# Patient Record
Sex: Male | Born: 1935 | Race: Black or African American | Hispanic: No | Marital: Married | Smoking: Never smoker
Health system: Southern US, Community
[De-identification: ages and names within clinical notes are randomized; demographics above are authoritative.]

## PROBLEM LIST (undated history)

## (undated) DIAGNOSIS — I10 Essential (primary) hypertension: Secondary | ICD-10-CM

## (undated) DIAGNOSIS — N289 Disorder of kidney and ureter, unspecified: Secondary | ICD-10-CM

## (undated) DIAGNOSIS — K589 Irritable bowel syndrome without diarrhea: Secondary | ICD-10-CM

## (undated) DIAGNOSIS — S37009A Unspecified injury of unspecified kidney, initial encounter: Secondary | ICD-10-CM

## (undated) HISTORY — DX: Unspecified injury of unspecified kidney, initial encounter: S37.009A

## (undated) HISTORY — DX: Irritable bowel syndrome, unspecified: K58.9

## (undated) HISTORY — DX: Disorder of kidney and ureter, unspecified: N28.9

## (undated) HISTORY — PX: CHOLECYSTECTOMY: SHX55

## (undated) HISTORY — DX: Essential (primary) hypertension: I10

---

## 2009-03-07 ENCOUNTER — Ambulatory Visit (HOSPITAL_COMMUNITY): Admission: RE | Admit: 2009-03-07 | Discharge: 2009-03-07 | Payer: Self-pay | Admitting: Otolaryngology

## 2010-03-12 ENCOUNTER — Encounter: Payer: Self-pay | Admitting: Orthopedic Surgery

## 2010-03-19 ENCOUNTER — Ambulatory Visit: Payer: Self-pay | Admitting: Orthopedic Surgery

## 2010-03-19 DIAGNOSIS — IMO0002 Reserved for concepts with insufficient information to code with codable children: Secondary | ICD-10-CM | POA: Insufficient documentation

## 2010-03-19 DIAGNOSIS — M1710 Unilateral primary osteoarthritis, unspecified knee: Secondary | ICD-10-CM | POA: Insufficient documentation

## 2010-03-31 IMAGING — CT CT NECK W/ CM
3 series · 14 of 33 positions shown, 17 images · IV contrast (Omnipaque 300)
Comparison: None.

CLINICAL DATA: 77-year-old male with right parotid swelling and
sialoadenitis.

CT NECK WITH CONTRAST
TECHNIQUE: Multidetector CT imaging of the neck was performed with
intravenous contrast.
Contrast: 75 ml Xmnipaque-M99.

[Series 2: soft tissue neck 3.0 b31s · axial · 0.40mm/px · z∈[+48,+228]mm · 6 of 78 slices shown, 8 images]
[im 12/78  soft-tissue]
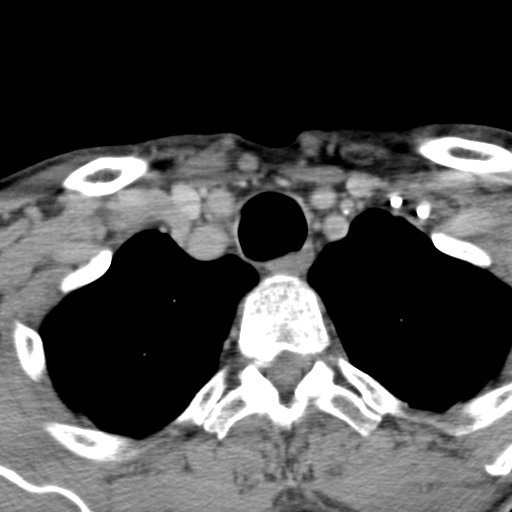
[im 12/78  bone]
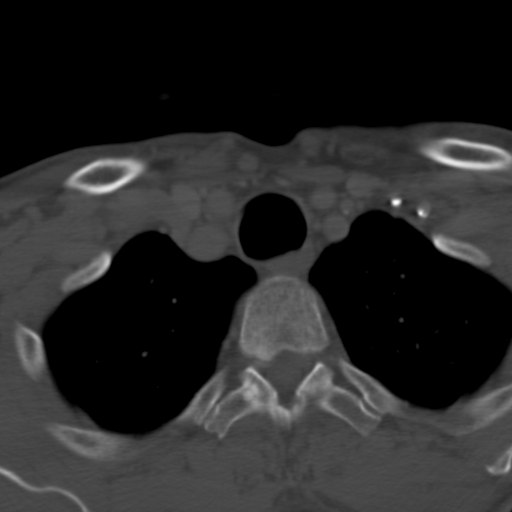
[im 24/78  bone]
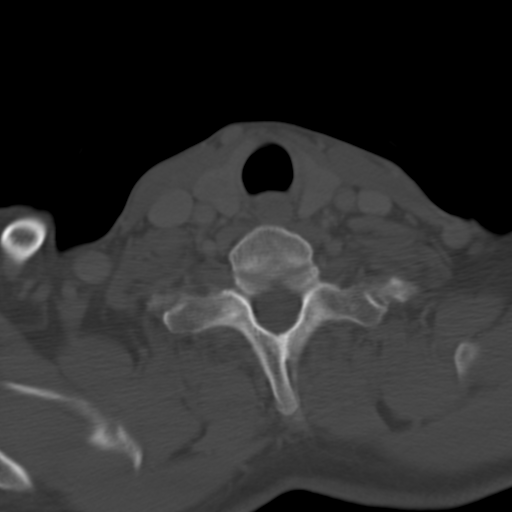
[im 36/78  bone]
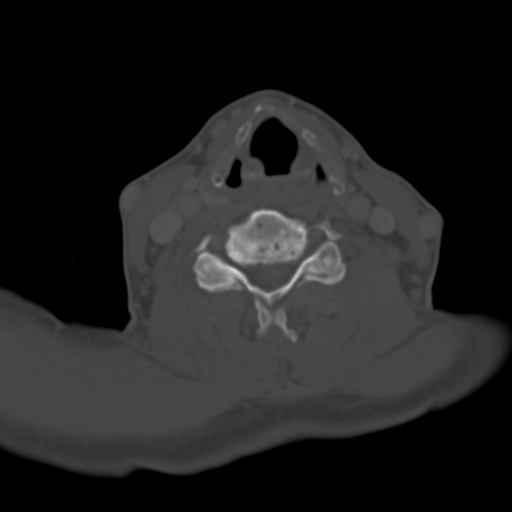
[im 48/78  bone]
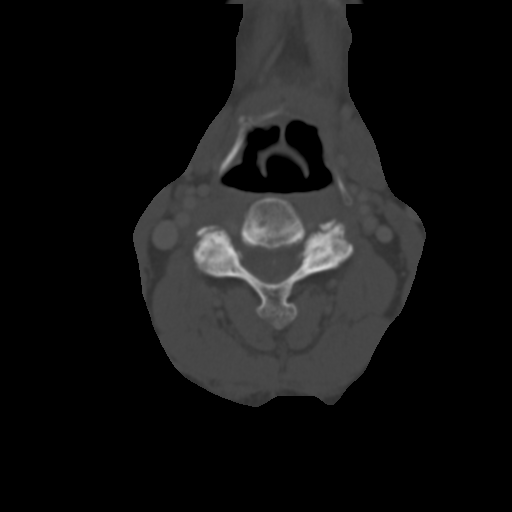
[im 60/78  soft-tissue]
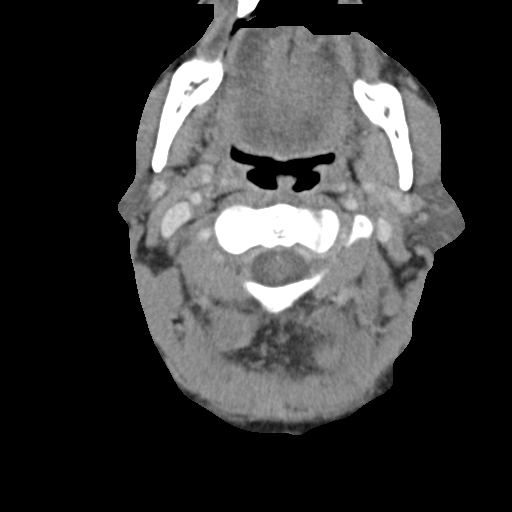
[im 60/78  bone]
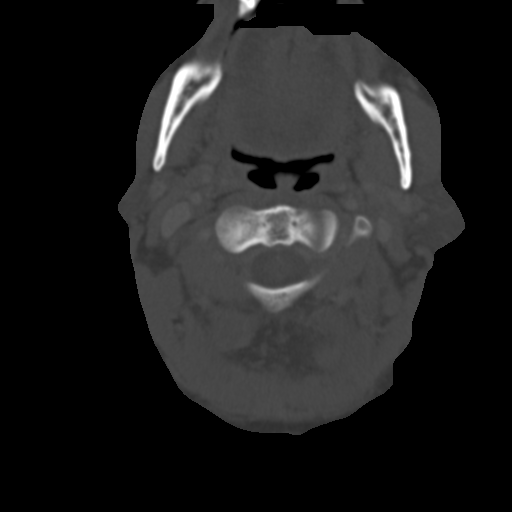
[im 72/78  bone]
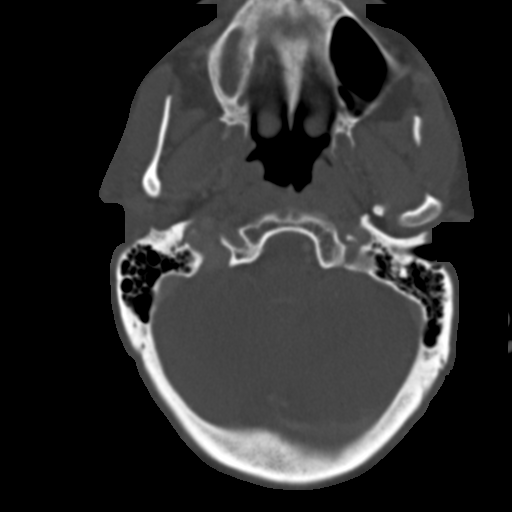

[Series 4: neck 3.0 soft tissue coronal · coronal · 0.35mm/px · 3 of 53 slices shown]
[im 11/53  bone]
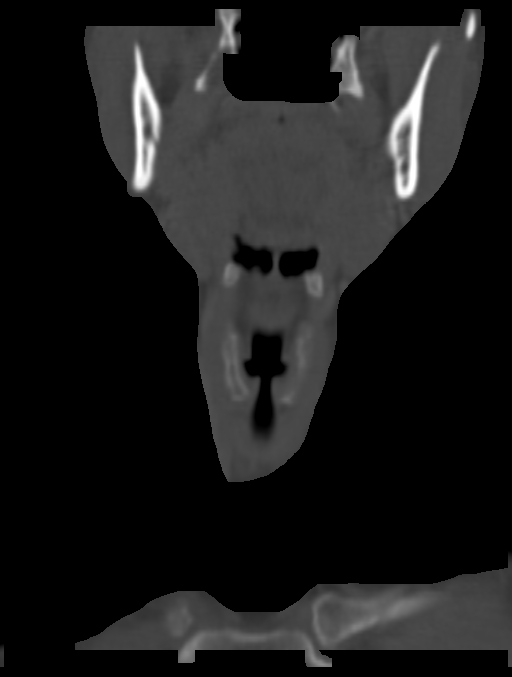
[im 21/53  bone]
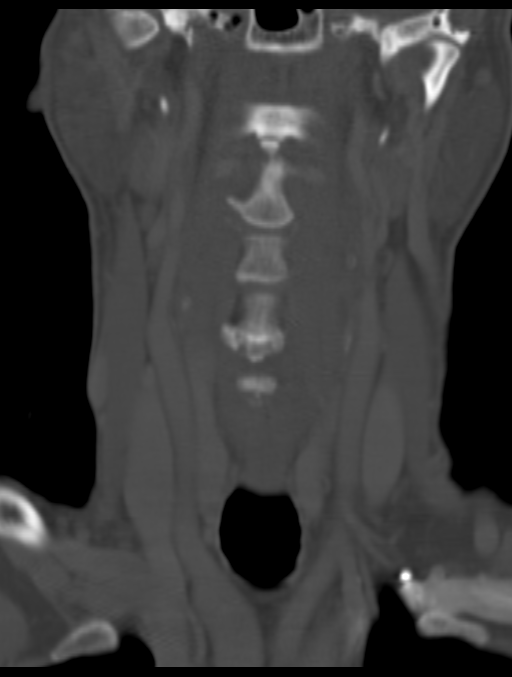
[im 32/53  bone]
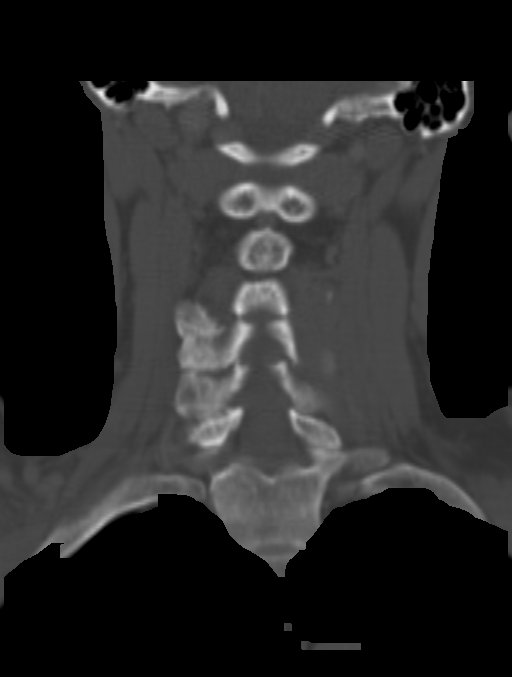

[Series 5: neck 3.0 soft tissue sag · sagittal · 0.38mm/px · 5 of 54 slices shown, 6 images]
[im 18/54  bone]
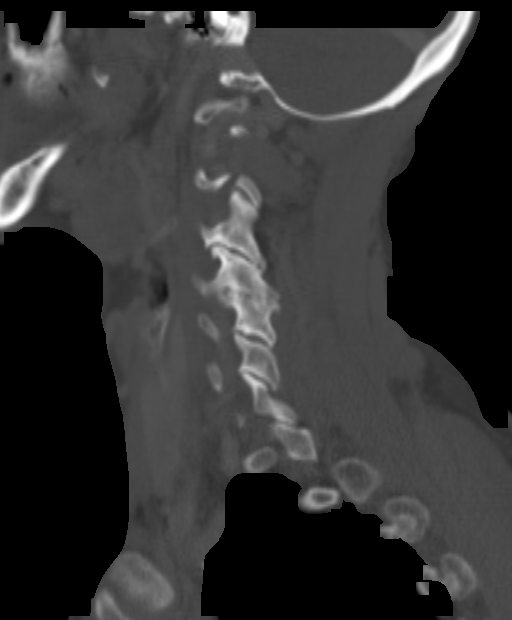
[im 23/54  bone]
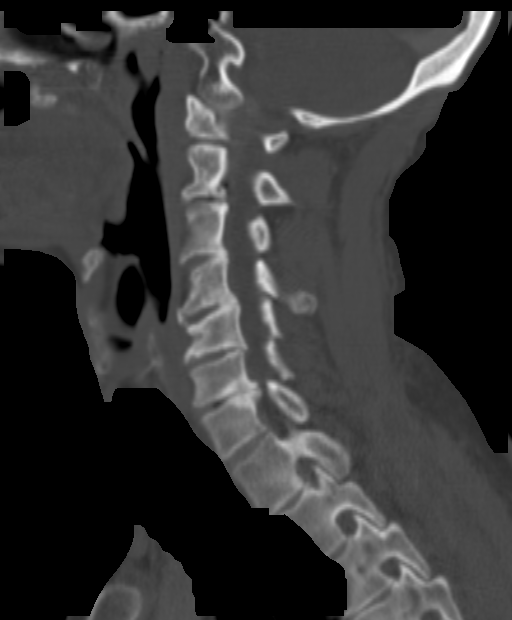
[im 27/54  soft-tissue]
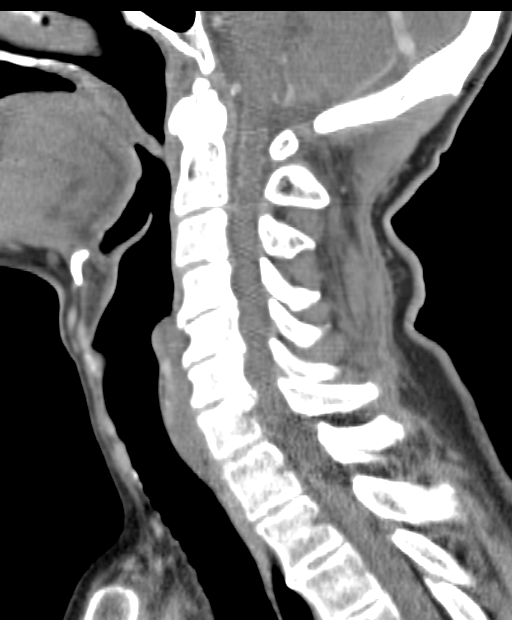
[im 27/54  bone]
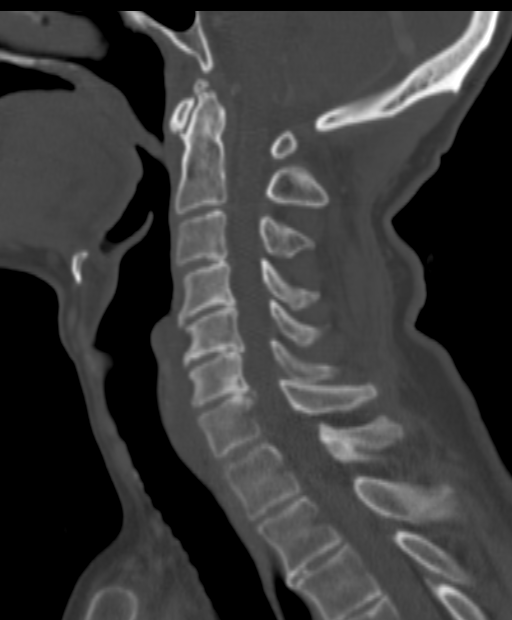
[im 31/54  bone]
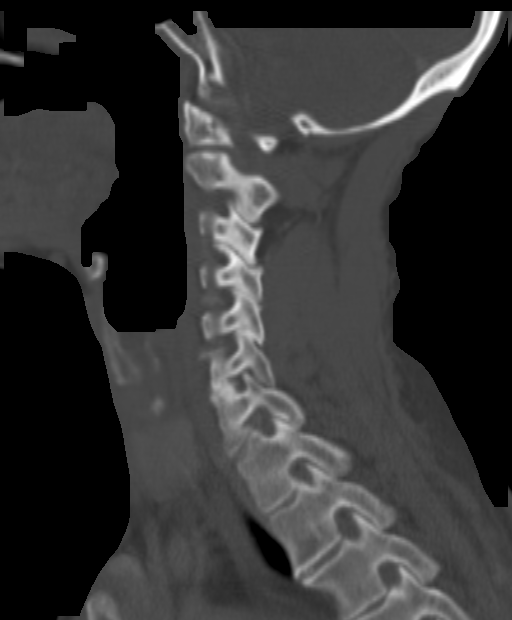
[im 36/54  bone]
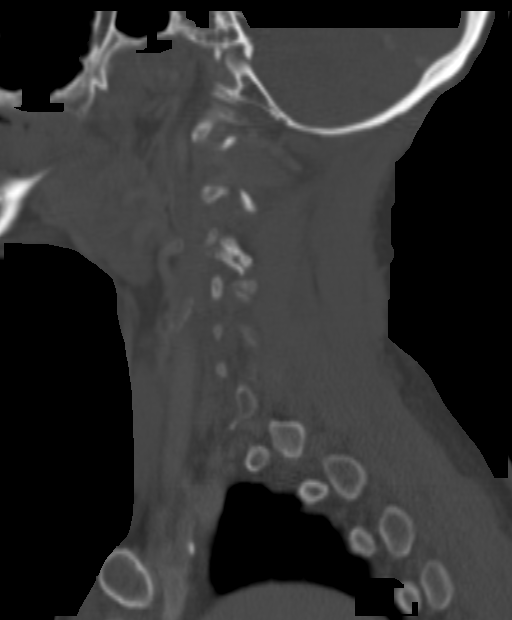

[14 of 33 positions shown; findings below may reference images not displayed]

FINDINGS: Partially calcified mucosal thickening in the right
maxillary sinus.  Otherwise, visualized paranasal sinuses and
mastoids are clear.  Multilevel disc and facet degeneration in the
cervical spine.  No acute osseous abnormality identified.
Visualized lung apices are clear.  Pharyngeal mucosal spaces are
within normal limits.  Parapharyngeal, retropharyngeal, and
sublingual spaces are within normal limits.  Submandibular glands
are within normal limits.  Thyroid is within normal limits.  Major
vascular structures in the neck are patent; the left vertebral
artery is nondominant.  There is a left greater than right carotid
bifurcation atherosclerosis.  No lymphadenopathy in the neck or
visualized superior mediastinum.

BB markers were placed on the area of clinical concern which does
correspond to the right parotid gland.  Parotid parenchyma
attenuation and enhancement appears symmetric, and no definite
inflammatory changes of the right parotid or surrounding
subcutaneous fat are noted.  Incidental accessory anterior parotid
tissue on the left is seen.  Both parotid gland ducts appear
nondilated and within normal limits.  No sialolithiasis is
identified.
IMPRESSION: 1.  CT appearance of the right parotid gland is within normal
limits.  No sialolithiasis identified.  Negative left parotid gland
also.
2.  No acute findings identified in the neck.
3.  Left greater than right carotid bifurcation atherosclerosis.
Degenerative changes in the cervical spine.  Right maxillary
chronic sinusitis.

## 2010-11-30 ENCOUNTER — Encounter: Payer: Self-pay | Admitting: Otolaryngology

## 2010-12-02 ENCOUNTER — Ambulatory Visit: Admit: 2010-12-02 | Payer: Self-pay | Admitting: Internal Medicine

## 2010-12-09 NOTE — Letter (Signed)
Summary: History form  History form   Imported By: Jacklynn Ganong 03/20/2010 08:08:38  _____________________________________________________________________  External Attachment:    Type:   Image     Comment:   External Document

## 2010-12-09 NOTE — Assessment & Plan Note (Signed)
Summary: bi knee pain needs xr/humana/bsf   Vital Signs:  Patient profile:   77 year old male Height:      73 inches Weight:      177 pounds Pulse rate:   66 / minute Resp:     16 per minute  Vitals Entered By: Fuller Canada MD (Mar 19, 2010 9:08 AM)  Visit Type:  new patient Referring Provider:  self Primary Provider:  Dr. Sherril Croon  CC:  right knee pain.  History of Present Illness: 77 year old male recently seen in the medical history of hypertension. 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 pedis presents for evaluation of bilateral knee pain, RIGHT greater than LEFT.  Dr. Addison Bailey records indicate multiple injections in the RIGHT knee, including Orthovisc.  Patient says his functional activities are fairly good. He has pain when he walks.  Quality throbbing. Scale 9/10. Constant. Came on gradually associated symptoms or swelling. Denies locking or catching.     Allergies (verified): No Known Drug Allergies  Past History:  Past Medical History: htn  Past Surgical History: gallbladder  Family History: FH of Cancer:  Family History Coronary Heart Disease male < 53 Family History of Arthritis  Social History: Patient is married.  assembler no smoking no alcohol coffee daily  Review of Systems Respiratory:  Complains of snoring; denies short of breath, wheezing, couch, tightness, pain on inspiration, and snoring . Gastrointestinal:  Complains of diarrhea; denies heartburn, nausea, vomiting, constipation, and blood in your stools. Musculoskeletal:  Complains of joint pain; denies swelling, instability, stiffness, redness, heat, and muscle pain.  The review of systems is negative for Constitutional, Cardiovascular, Genitourinary, Neurologic, Endocrine,  Psychiatric, Skin, HEENT, Immunology, and Hemoatologic.  Physical Exam  Additional Exam:  1. General appearance was normal  2. Oriented x 3  3. Mood was normal  4. Gait was abnormal, varus alignment in both knees  bilateral varus deformity with medial joint line tenderness bilaterally, RIGHT greater than LEFT.  Range of motion 105 with a flexion contracture 5 in each knee. Joint stability was normal. Motor, graded as 5.  Lower extremities. Skin was normal. Pulses normal;  normal sensation;  normal reflexes    Impression & Recommendations:  Problem # 1:  DEGENERATIVE JOINT DISEASE, KNEES, BILATERAL (ICD-715.96) Assessment New  bilateral knee films the patient has bone to bone changes medial compartment with moderate varus deformity, RIGHT greater than LEFT patellofemoral joint only mildly involved.  Impression bilateral osteoarthritis with good function at this time.  Recommend aspiration injection RIGHT knee, Tylenol arthritis 3 times a day for pain.  Recommend joint replacement, class.  A replacement pamphlet recommend reading that as well.  Verbal consent was obtained. The knee was prepped with alcohol and ethyl chloride. 1 cc of depomedrol 40mg /cc and 4 cc of lidocaine 1% was injected. there were no complications. Aspirated approximately 25 cc of clear, yellow fluid from the RIGHT knee prior to her injection  Orders: New Patient Level IV (21308) Knees  x-ray bilateral ([id number]) Depo- Medrol 40mg  (J1030) Joint Aspirate / Injection, Large (20610)  Patient Instructions: 1)  You have received an injection of cortisone today. You may experience increased pain at the injection site. Apply ice pack to the area for 20 minutes every 2 hours and take 2 xtra strength tylenol every 8  hours. This increased pain will usually resolve in 24 hours. The injection will take effect in 3-10 days.  2)  tylenol arthritis for pain  up to three times a day  3)  Please schedule a follow-up  appointment in 6 months.

## 2010-12-09 NOTE — Letter (Signed)
Summary: Previous notes brought by the patient  Previous notes brought by the patient   Imported By: Jacklynn Ganong 03/19/2010 16:19:44  _____________________________________________________________________  External Attachment:    Type:   Image     Comment:   External Document

## 2011-01-06 ENCOUNTER — Ambulatory Visit (INDEPENDENT_AMBULATORY_CARE_PROVIDER_SITE_OTHER): Payer: Medicare HMO | Admitting: Internal Medicine

## 2011-01-06 DIAGNOSIS — R1031 Right lower quadrant pain: Secondary | ICD-10-CM

## 2011-01-06 DIAGNOSIS — R1032 Left lower quadrant pain: Secondary | ICD-10-CM

## 2011-01-06 DIAGNOSIS — R197 Diarrhea, unspecified: Secondary | ICD-10-CM

## 2011-02-04 ENCOUNTER — Ambulatory Visit (HOSPITAL_COMMUNITY)
Admission: RE | Admit: 2011-02-04 | Discharge: 2011-02-04 | Disposition: A | Discharge status: Home / Self Care | Payer: Medicare HMO | Source: Ambulatory Visit | Attending: Internal Medicine | Admitting: Internal Medicine

## 2011-02-04 ENCOUNTER — Encounter (HOSPITAL_BASED_OUTPATIENT_CLINIC_OR_DEPARTMENT_OTHER): Payer: Medicare HMO | Admitting: Internal Medicine

## 2011-02-04 ENCOUNTER — Other Ambulatory Visit (INDEPENDENT_AMBULATORY_CARE_PROVIDER_SITE_OTHER): Payer: Self-pay | Admitting: Internal Medicine

## 2011-02-04 DIAGNOSIS — R197 Diarrhea, unspecified: Secondary | ICD-10-CM | POA: Insufficient documentation

## 2011-02-04 DIAGNOSIS — R109 Unspecified abdominal pain: Secondary | ICD-10-CM

## 2011-02-04 DIAGNOSIS — K5730 Diverticulosis of large intestine without perforation or abscess without bleeding: Secondary | ICD-10-CM | POA: Insufficient documentation

## 2011-02-04 DIAGNOSIS — I10 Essential (primary) hypertension: Secondary | ICD-10-CM | POA: Insufficient documentation

## 2011-02-04 DIAGNOSIS — Z79899 Other long term (current) drug therapy: Secondary | ICD-10-CM | POA: Insufficient documentation

## 2011-02-04 DIAGNOSIS — K648 Other hemorrhoids: Secondary | ICD-10-CM | POA: Insufficient documentation

## 2011-02-17 NOTE — Consult Note (Signed)
Kevin [name], [name]                 ACCOUNT NO.:  [id number]  MEDICAL RECORD NO.:  [id number]           PATIENT TYPE:  LOCATION:                                 FACILITY:  PHYSICIAN:  Lionel December, M.D.    DATE OF BIRTH:  11-Sep-1931  DATE OF CONSULTATION: DATE OF DISCHARGE:                                CONSULTATION   REASON FOR CONSULTATION:  Diarrhea and lower abdominal pain.  HISTORY OF PRESENT ILLNESS:  Kevin [name] is a 77 year old black male referred to our office by Dr. Sherril Croon for possible irritable bowel syndrome.  Kevin [name] states that when he eats he will immediately have to go and have a bowel movement.  He states that this occurs about every time he eats.  His symptoms have been worse in the last year.  He denies any type of constipation.  He states his stools are loose and sometimes they are watery.  With his bowel movements, he will have lower abdominal pain, which will resolve after having a bowel movement.  He usually have two or three stools a day.  His appetite has been okay.  He has had no recent weight loss.  No rectal bleeding or melena.  His stools are tan in color.  He did tell me that years ago, Imodium helped with his symptoms.  He does state that he went to [place], [place] recently and had a cheeseburger and before getting back home, he had three stools.  His last colonoscopy was in 2003 by Dr. Gabriel Cirri, which revealed a normal colonoscopy, this was for screening purposes.  There were no lesions.  REVIEW OF SYSTEMS:  He denies any weight loss, no appetite changes, no nausea or vomiting.  No constipation.  No melena or rectal bleeding.  There are no known allergies.  MEDICAL HISTORY:  He had a cholecystectomy over 20 years ago and history of hypertension.  FAMILY HISTORY:  His mother is deceased from natural causes at age 53. His father is deceased from black lung.  He has one brother alive with prostate cancer.  He is married.  He is retired from  [employer]. He does not smoke, drink or do drugs.  He has one child in good health.  HOME MEDICATIONS:  Amlodipine 10 mg daily and Metamucil as needed.  OBJECTIVE:  VITAL SIGNS:  His weight is 179.5, height 6 feet and 1 inch, temperature 97.1, blood pressure 152/50, pulse 56. HEENT:  He has upper dentures and natural lower teeth.  His oral mucosa is moist.  There are no lesions.  His conjunctiva is pink.  His sclerae are anicteric.  His thyroid is normal. NECK:  There is no cervical lymphadenopathy. LUNGS:  Clear. HEART:  Regular rate and rhythm. ABDOMEN:  Soft.  Bowel sounds are positive.  No masses and no tenderness.  His stool was brown, guaiac negative. EXTREMITIES:  There is no edema to his lower extremities.  LABORATORY DATA:  On December 19, 2010; glucose 115, BUN 20, creatinine 1.66, calcium 9.0, total protein 6.8, albumin 3.7, ALP 85, AST 16, total bilirubin 0.3, ALT 12.  Sodium 140,  potassium 3.5, chloride 108. Hemoglobin is 13.0, hematocrit is 38.2, MCV 88.4, platelets are 177. Hepatitis C antibody was negative.  ANA is negative.  ASSESSMENT:  Kevin [name] is a 77 year old male presenting today with complaints of frequent stools up to two to three a day and lower abdominal pain is also associated with his loose stools.  This could possibly a component of irritable bowel syndrome.  His loose stools are related to meals.  Colonic neoplasm needs to be ruled out, this is probably irritable bowel syndrome.  RECOMMENDATIONS:  His last colonoscopy was in 2003.  It is reasonable to proceed with a colonoscopy to be sure he does not have a colonic neoplasm.  He can take Imodium on schedule twice a day.  Thank you for allowing [place] to participate in his care.    ______________________________ Dorene Ar, NP   ______________________________ Lionel December, M.D.    TS/MEDQ  D:  01/06/2011  T:  01/07/2011  Job:  [id number]  cc:   Dr. Sherril Croon  Electronically Signed by Dorene Ar PA on 02/10/2011 08:39:46 AM Electronically Signed by Lionel December M.D. on 02/17/2011 02:44:56 PM

## 2011-02-17 NOTE — Op Note (Signed)
  NAMEJOUSHA, Kevin [name]                 ACCOUNT NO.:  [id number]  MEDICAL RECORD NO.:  [id number]           PATIENT TYPE:  O  LOCATION:  [facility]                          FACILITY:  [facility]  PHYSICIAN:  Lionel December, M.D.    DATE OF BIRTH:  October 09, 1931  DATE OF PROCEDURE: DATE OF DISCHARGE:                              OPERATIVE REPORT   PROCEDURE:  Colonoscopy.  INDICATIONS:  Kevin [name] a 77 year old African American male who has chronic diarrhea presumed to be due to irritable bowel syndrome.  He also has lower abdominal pain or cramps.  He is undergoing diagnostic colonoscopy.  Procedures were reviewed with the patient.  Informed consent was obtained.  MEDS FOR CONSCIOUS SEDATION: 1. Demerol 50 mg IV. 2. Versed 3 mg IV.  FINDINGS:  Procedure performed in endoscopy suite.  The patient's vital signs and O2 sat were monitored during the procedure and remained stable.  The patient was placed in left lateral recumbent position. Rectal examination performed.  No abnormality noted on external or digital exam.  Pentax videoscope was placed into the rectum and advanced under vision into sigmoid colon which was tortuous and somewhat noncompliant.  Multiple diverticula noted at sigmoid with few more at the descending colon.  Using abdominal pressure and by repeatedly withdrawing scope, I was able to advance it further and finally into the cecum which was identified by appendiceal orifice and ileocecal valve. Pictures were taken for the record.  Prep was excellent.  As the scope was withdrawn, colonic mucosa was carefully examined and was normal throughout.  Random biopsies were taken from mucosa at distal sigmoid colon looking for collagenous or microscopic colitis.  Rectal mucosa was normal.  Scope was retroflexed to examine anorectal junction.  Single small hemorrhoid noted above the dentate line.  Endoscope was then withdrawn.  Withdrawal time was 14 minutes.  The patient tolerated the procedure  well.  FINAL DIAGNOSES: 1. Left-sided diverticulosis.  He has multiple diverticula at sigmoid     and few at descending colon. 2. No evidence of colonic polyps or endoscopic colitis. 3. Random biopsies taken from mucosa of sigmoid colon. 4. Small internal hemorrhoids.  RECOMMENDATIONS: 1. He will resume his usual meds. 2. High-fiber diet plus fiber supplement 3-4 g daily. 3. Imodium OTC 2 mg daily before breakfast.  I will be contacting the     patient with results of biopsy and further recommendations.     Lionel December, M.D.     NR/MEDQ  D:  02/04/2011  T:  02/05/2011  Job:  [id number]  cc:   Dr. Sherril Croon  Electronically Signed by Lionel December M.D. on 02/17/2011 02:45:35 PM

## 2011-07-01 ENCOUNTER — Encounter (INDEPENDENT_AMBULATORY_CARE_PROVIDER_SITE_OTHER): Payer: Self-pay | Admitting: Internal Medicine

## 2011-07-01 ENCOUNTER — Ambulatory Visit (INDEPENDENT_AMBULATORY_CARE_PROVIDER_SITE_OTHER): Payer: Medicare HMO | Admitting: Internal Medicine

## 2011-07-01 VITALS — BP 122/50 | Temp 97.9°F | Ht 73.0 in | Wt 169.8 lb

## 2011-07-01 DIAGNOSIS — K589 Irritable bowel syndrome without diarrhea: Secondary | ICD-10-CM

## 2011-07-01 DIAGNOSIS — K580 Irritable bowel syndrome with diarrhea: Secondary | ICD-10-CM

## 2011-07-01 MED ORDER — HYOSCYAMINE SULFATE 0.125 MG SL SUBL: 0.1250 mg | SUBLINGUAL_TABLET | Freq: Two times a day (BID) | SUBLINGUAL | Status: DC

## 2011-07-01 NOTE — Progress Notes (Signed)
Subjective:     Patient ID: Kevin [name], male   DOB: 09-11-1931, 77 y.o.   MRN: [id number]  HPI Kevin [name] is a 77 yr old black male presents today with c/o of IBS.  He says as soon as he eats he has to go to the bathroom about 2-3 times to have a BM.  He is having 4-5 stools a day.  The first stool is formed and then the rest will be loose.  When he has to have a BM he will have lower abdominal cramps.  Appetite is good. He has lost about 10 pounds since his last visit. No acid reflux.  No epigastric pain.  No rectal bleeding or melena. He underwent a cholecystectomy over 25 yrs ago.  He says for the most part he feels good.  He underwent a colonoscopy in March of this year for chronic diarrhea. Findings: Left sided diverticulosis.  Multiple diverticula at sigmoid colon and few at descending colon.  No evidence of colonic polyps or endoscopic colitis. Small internal hemorrhoids. Biopsy: No evidence of microscopic colitis.    Current Outpatient Prescriptions  Medication Sig Dispense Refill  . amLODipine (NORVASC) 10 MG tablet Take 10 mg by mouth daily.        Marland Kitchen loperamide (IMODIUM A-D) 2 MG tablet Take 2 mg by mouth 4 (four) times daily as needed.        . hyoscyamine (LEVSIN/SL) 0.125 MG SL tablet Place 1 tablet (0.125 mg total) under the tongue 2 (two) times daily.  60 tablet  3   Allergies: NKA  Review of Systems see hpi     Objective:   Physical Exam Blood pressure 122/50, temperature 97.9 F (36.6 C), height 6\' 1"  (1.854 m), weight 169 lb 12.8 oz (77.021 kg). Alert and oriented. Skin warm and dry. Oral mucosa is moist.  Dentures upper and lower.Sclera anicteric, conjunctivae is pink. Thyroid not enlarged. No cervical lymphadenopathy. Lungs clear. Heart regular rate and rhythm.  Abdomen is soft. Bowel sounds are positive. No hepatomegaly. No abdominal masses felt. No tenderness.  No edema to lower extremities. Patient is alert and oriented.      Assessment:     This is probably Irritable  syndrome given his history of frequent stools.    Plan:     Continue to Imodium once a day.  He will start fiber pills. I will e-prescribe Levsin 0.125mg  sl q 12hrs  # 60 with 3 refllls .  He will follow up in 3 months.

## 2011-09-10 ENCOUNTER — Encounter (INDEPENDENT_AMBULATORY_CARE_PROVIDER_SITE_OTHER): Payer: Self-pay | Admitting: Internal Medicine

## 2011-09-21 ENCOUNTER — Ambulatory Visit (INDEPENDENT_AMBULATORY_CARE_PROVIDER_SITE_OTHER): Payer: Medicare HMO | Admitting: Internal Medicine

## 2011-09-23 ENCOUNTER — Encounter (INDEPENDENT_AMBULATORY_CARE_PROVIDER_SITE_OTHER): Payer: Self-pay | Admitting: Internal Medicine

## 2011-09-23 ENCOUNTER — Ambulatory Visit (INDEPENDENT_AMBULATORY_CARE_PROVIDER_SITE_OTHER): Payer: Medicare HMO | Admitting: Internal Medicine

## 2011-09-23 DIAGNOSIS — K589 Irritable bowel syndrome without diarrhea: Secondary | ICD-10-CM

## 2011-09-23 DIAGNOSIS — I10 Essential (primary) hypertension: Secondary | ICD-10-CM

## 2011-09-23 MED ORDER — DICYCLOMINE HCL 20 MG PO TABS: 20.0000 mg | ORAL_TABLET | Freq: Two times a day (BID) | ORAL | Status: DC

## 2011-09-23 NOTE — Progress Notes (Signed)
Subjective:     Patient ID: Kevin [name], male   DOB: 1931/10/23, 77 y.o.   MRN: [id number]  HPI Kevin [name] is a 77 yr old male here today for a scheduled visit.  He is presently taking Imodium prn. He is having 2-3 times a day.  Some are formed and some are not. Some are loose. He had tried Levsin and it did not help. Appetite is okay . He has lost about 3 pounds. No melena or bright red rectal bleeding. He has had IBS for years.  No bloating.  His last colonoscopy was in March of this year which revealed left sided diverticulosis.. Multiple diverticula at the sigmoid and a few at descending colon.   Review of Systems see hpi     Current Outpatient Prescriptions  Medication Sig Dispense Refill  . amLODipine (NORVASC) 10 MG tablet Take 10 mg by mouth daily.        Marland Kitchen loperamide (IMODIUM A-D) 2 MG tablet Take 2 mg by mouth 4 (four) times daily as needed.        . dicyclomine (BENTYL) 20 MG tablet Take 1 tablet (20 mg total) by mouth 2 (two) times daily before a meal.  60 tablet  1   Current Outpatient Prescriptions on File Prior to Visit  Medication Sig Dispense Refill  . amLODipine (NORVASC) 10 MG tablet Take 10 mg by mouth daily.        Marland Kitchen loperamide (IMODIUM A-D) 2 MG tablet Take 2 mg by mouth 4 (four) times daily as needed.         Past Medical History  Diagnosis Date  . Hypertension    History   Social History Narrative  . No narrative on file   History   Social History  . Marital Status: Married    Spouse Name: N/A    Number of Children: N/A  . Years of Education: N/A   Occupational History  . Not on file.   Social History Main Topics  . Smoking status: Never Smoker   . Smokeless tobacco: Not on file  . Alcohol Use: No  . Drug Use: No  . Sexually Active: Not on file   Other Topics Concern  . Not on file   Social History Narrative  . No narrative on file   Family Status  Relation Status Death Age  . Mother Deceased     age 97  . Father Deceased     Cold miners  disease  . Brother Alive     Hx prostate cancer and doing well   No Known Allergies  Objective:   Physical Exam  Filed Vitals:   09/23/11 1433  BP: 120/50  Pulse: 60  Temp: 97.7 F (36.5 C)  Height: 6\' 1"  (1.854 m)  Weight: 170 lb 14.4 oz (77.52 kg)   Alert and oriented. Skin warm and dry. Oral mucosa is moist.dentures. Sclera anicteric, conjunctivae is pink. Thyroid not enlarged. No cervical lymphadenopathy. Lungs clear. Heart regular rate and rhythm.  Abdomen is soft. Bowel sounds are positive. No hepatomegaly. No abdominal masses felt. No tenderness.  No edema to lower extremities. Patient is alert and oriented.      Assessment:    Probably IBS.  He recently underwent a normal colonoscopy.     Plan:     *F/u 1 year. Will call in Dicyclomine in for him to try. He will call with a PR in 2 weeks. High fiber diet.

## 2011-09-23 NOTE — Patient Instructions (Signed)
High fiber diet. Dicyclomine 10mg  BID. F/u in one year. PR in 2 weeks.

## 2012-03-15 ENCOUNTER — Encounter (INDEPENDENT_AMBULATORY_CARE_PROVIDER_SITE_OTHER): Payer: Self-pay | Admitting: *Deleted

## 2012-04-06 ENCOUNTER — Encounter (INDEPENDENT_AMBULATORY_CARE_PROVIDER_SITE_OTHER): Payer: Self-pay | Admitting: Internal Medicine

## 2012-04-06 ENCOUNTER — Ambulatory Visit (INDEPENDENT_AMBULATORY_CARE_PROVIDER_SITE_OTHER): Payer: Medicare HMO | Admitting: Internal Medicine

## 2012-04-06 VITALS — BP 148/52 | HR 69 | Temp 98.1°F | Ht 72.0 in | Wt 171.9 lb

## 2012-04-06 DIAGNOSIS — K589 Irritable bowel syndrome without diarrhea: Secondary | ICD-10-CM

## 2012-04-06 NOTE — Patient Instructions (Addendum)
Dicyclomine 20mg  TID. PR in 2 weeks.  OV in 6 months

## 2012-04-06 NOTE — Progress Notes (Signed)
Subjective:     Patient ID: Kevin [name], male   DOB: 10-23-1931, 78 y.o.   MRN: [id number]  HPI Kevin [name] is a 78 yr old male here today for f/u.Marland Kitchen  He says he is not any better. He is having 2-3 stools a day. Yesterday he had 7 stools. Stool were very loose. Appetite is good. No weight loss.  No abdominal pain except when he has a BM.   He tells me he has not been taking the Dicyclomine. He walks every day.  He is presently working a Statistician.  He takes Imodium on a prn basis.  His last colonoscopy was last year in March. Revealed left sided diverticulosis. Multiple diverticula at the sigmoid and a few at descending colon.    Review of Systems see hpi Current Outpatient Prescriptions  Medication Sig Dispense Refill  . amLODipine (NORVASC) 10 MG tablet Take 10 mg by mouth daily.        Marland Kitchen dicyclomine (BENTYL) 20 MG tablet Take 1 tablet (20 mg total) by mouth 2 (two) times daily before a meal.  60 tablet  1  . metoprolol tartrate (LOPRESSOR) 25 MG tablet Take 25 mg by mouth daily.      . hyoscyamine (LEVBID) 0.375 MG 12 hr tablet Take 0.375 mg by mouth every 12 (twelve) hours as needed.      . hyoscyamine (LEVSIN/SL) 0.125 MG SL tablet Place 1 tablet (0.125 mg total) under the tongue 2 (two) times daily.  60 tablet  3  . loperamide (IMODIUM A-D) 2 MG tablet Take 2 mg by mouth 4 (four) times daily as needed.         Past Medical History  Diagnosis Date  . Hypertension   . Irritable bowel    Past Surgical History  Procedure Date  . Cholecystectomy     over 20 yrs ago   Family Status  Relation Status Death Age  . Mother Deceased     age 31  . Father Deceased     Cold miners disease  . Brother Alive     Hx prostate cancer and doing well   History   Social History  . Marital Status: Married    Spouse Name: N/A    Number of Children: N/A  . Years of Education: N/A   Occupational History  . Not on file.   Social History Main Topics  . Smoking status: Never Smoker   . Smokeless  tobacco: Not on file  . Alcohol Use: No  . Drug Use: No  . Sexually Active: Not on file   Other Topics Concern  . Not on file   Social History Narrative  . No narrative on file   No Known Allergies      Objective:   Physical Exam Filed Vitals:   04/06/12 1109  Height: 6' (1.829 m)  Weight: 171 lb 14.4 oz (77.973 kg)  Alert and oriented. Skin warm and dry. Oral mucosa is moist.   . Sclera anicteric, conjunctivae is pink. Thyroid not enlarged. No cervical lymphadenopathy. Lungs clear. Heart regular rate and rhythm.  Abdomen is soft. Bowel sounds are positive. No hepatomegaly. No abdominal masses felt. No tenderness.  No edema to lower extremities.        Assessment:   Irritable bowel diarrhea predominant.     Plan:   Dicyclomine TID. PR in 2 weeks.  OV in 6 months.  Stop the Levsin

## 2012-04-11 ENCOUNTER — Other Ambulatory Visit (INDEPENDENT_AMBULATORY_CARE_PROVIDER_SITE_OTHER): Payer: Self-pay | Admitting: Internal Medicine

## 2012-04-11 NOTE — Telephone Encounter (Signed)
This has been addressed.

## 2012-04-12 NOTE — Telephone Encounter (Signed)
Talked with Kevin [name]; patient is at work. Advice that Kevin [name] stop dicyclomine and take Imodium OTC 2 mg once or twice daily for diarrhea. If Imodium does not work Kevin [name] will call us

## 2012-08-05 ENCOUNTER — Encounter: Payer: Medicare HMO | Admitting: Internal Medicine

## 2012-08-12 ENCOUNTER — Encounter (INDEPENDENT_AMBULATORY_CARE_PROVIDER_SITE_OTHER): Payer: Medicare HMO | Admitting: Internal Medicine

## 2012-08-12 DIAGNOSIS — D472 Monoclonal gammopathy: Secondary | ICD-10-CM

## 2012-08-12 DIAGNOSIS — N189 Chronic kidney disease, unspecified: Secondary | ICD-10-CM

## 2012-09-15 ENCOUNTER — Encounter: Payer: Self-pay | Admitting: Orthopedic Surgery

## 2012-09-15 ENCOUNTER — Ambulatory Visit (INDEPENDENT_AMBULATORY_CARE_PROVIDER_SITE_OTHER): Payer: Medicare HMO | Admitting: Orthopedic Surgery

## 2012-09-15 VITALS — Ht 73.0 in | Wt 170.0 lb

## 2012-09-15 DIAGNOSIS — I878 Other specified disorders of veins: Secondary | ICD-10-CM

## 2012-09-15 DIAGNOSIS — I872 Venous insufficiency (chronic) (peripheral): Secondary | ICD-10-CM

## 2012-09-15 NOTE — Patient Instructions (Addendum)
Consult with [facility]:  For varicose veins   Varicose Veins Varicose veins are veins that have become enlarged and twisted. CAUSES This condition is the result of valves in the veins not working properly. Valves in the veins help return blood from the leg to the heart. If these valves are damaged, blood flows backwards and backs up into the veins in the leg near the skin. This causes the veins to become larger. People who are on their feet a lot, who are pregnant, or who are overweight are more likely to develop varicose veins. SYMPTOMS    Bulging, twisted-appearing, bluish veins, most commonly found on the legs.   Leg pain or a feeling of heaviness. These symptoms may be worse at the end of the day.   Leg swelling.   Skin color changes.  DIAGNOSIS   Varicose veins can usually be diagnosed with an exam of your legs by your caregiver. He or she may recommend an ultrasound of your leg veins. TREATMENT   Most varicose veins can be treated at home. However, other treatments are available for people who have persistent symptoms or who want to treat the cosmetic appearance of the varicose veins. These include:  Laser treatment of very small varicose veins.   Medicine that is shot (injected) into the vein. This medicine hardens the walls of the vein and closes off the vein. This treatment is called sclerotherapy. Afterwards, you may need to wear clothing or bandages that apply pressure.   Surgery.  HOME CARE INSTRUCTIONS    Do not stand or sit in one position for long periods of time. Do not sit with your legs crossed. Rest with your legs raised during the day.   Wear elastic stockings or support hose. Do not wear other tight, encircling garments around the legs, pelvis, or waist.   Walk as much as possible to increase blood flow.   Raise the foot of your bed at night with 2-inch blocks.   If you get a cut in the skin over the vein and the vein bleeds, lie down with your  leg raised and press on it with a clean cloth until the bleeding stops. Then place a bandage (dressing) on the cut. See your caregiver if it continues to bleed or needs stitches.  SEEK MEDICAL CARE IF:    The skin around your ankle starts to break down.   You have pain, redness, tenderness, or hard swelling developing in your leg over a vein.   You are uncomfortable due to leg pain.  Document Released: 08/05/2005 Document Revised: 01/18/2012 Document Reviewed: 12/22/2010 [facility] Patient Information 2013 [place], [place].

## 2012-09-15 NOTE — Progress Notes (Signed)
Patient ID: Kevin [name], male   DOB: 1931-09-10, 78 y.o.   MRN: [id number] Chief Complaint  Patient presents with  . Leg Pain    Left leg pain, no injury.   78 year old male complains of pain and swelling of his left lower extremity. He saw his primary care doctor showed this to him he recommended a venous stasis stocking which the patient wore but did not get better. Symptoms started gradually associated with burning 6/10 constant pain associated with swelling and changes of the skin  Review of systems history diarrhea frequency urgency joint pain joint swelling  Past Medical History  Diagnosis Date  . Hypertension   . Irritable bowel     General appearance normal Orientation normal Mood and affect normal Gait and station normal Inspection of the left leg shows that there is skin discoloration also noted on the right leg. There is some tenderness some mild pitting edema. Ankle and knee range of motion are normal ankle and knee joints are stable. Muscle tone normal. Skin changes as noted. Pulse and temperature normal. Edema is noted. Multiple varicosities noted. Sensation normal. Balance normal.  Venous stasis disease  Recommend calf high prescription strength stockings and referral to [facility]

## 2012-09-20 ENCOUNTER — Telehealth: Payer: Self-pay | Admitting: Radiology

## 2012-09-20 NOTE — Telephone Encounter (Signed)
I faxed a referral for the patient to [facility].

## 2012-09-22 ENCOUNTER — Other Ambulatory Visit: Payer: Self-pay | Admitting: *Deleted

## 2012-09-22 DIAGNOSIS — I83893 Varicose veins of bilateral lower extremities with other complications: Secondary | ICD-10-CM

## 2012-09-23 ENCOUNTER — Telehealth: Payer: Self-pay | Admitting: Radiology

## 2012-09-23 NOTE — Telephone Encounter (Signed)
Patient has appointment with Dr. Hart Rochester on 10-10-12 at 11:00.

## 2012-10-05 ENCOUNTER — Encounter: Payer: Self-pay | Admitting: Vascular Surgery

## 2012-10-05 ENCOUNTER — Ambulatory Visit (INDEPENDENT_AMBULATORY_CARE_PROVIDER_SITE_OTHER): Payer: Medicare HMO | Admitting: Internal Medicine

## 2012-10-10 ENCOUNTER — Encounter: Payer: Self-pay | Admitting: Vascular Surgery

## 2012-10-10 ENCOUNTER — Ambulatory Visit (INDEPENDENT_AMBULATORY_CARE_PROVIDER_SITE_OTHER): Payer: Medicare HMO | Admitting: Vascular Surgery

## 2012-10-10 ENCOUNTER — Encounter (INDEPENDENT_AMBULATORY_CARE_PROVIDER_SITE_OTHER): Payer: Medicare HMO | Admitting: *Deleted

## 2012-10-10 VITALS — BP 165/66 | HR 52 | Resp 16 | Ht 73.0 in | Wt 171.0 lb

## 2012-10-10 DIAGNOSIS — I83893 Varicose veins of bilateral lower extremities with other complications: Secondary | ICD-10-CM

## 2012-10-10 DIAGNOSIS — I872 Venous insufficiency (chronic) (peripheral): Secondary | ICD-10-CM

## 2012-10-10 NOTE — Progress Notes (Signed)
Subjective:     Patient ID: Kevin [name], male   DOB: 03-Aug-1931, 78 y.o.   MRN: [id number]  HPI this 78 year old male was referred for evaluation of possible venous insufficiency left leg worse than right. He has noticed swelling in the lower aspect of both legs over the last several months with some aching throbbing discomfort in the lower third of the leg as the day progresses. He has no history of DVT, thrombophlebitis, bleeding, or severe distal edema. He has recently begun wearing elastic compression stockings in both legs up to the knee with some improvement. He does not elevate his legs on a regular basis and has no history of pulmonary embolism.  Past Medical History  Diagnosis Date  . Hypertension   . Irritable bowel     History  Substance Use Topics  . Smoking status: Never Smoker   . Smokeless tobacco: Not on file  . Alcohol Use: No    No family history on file.  No Known Allergies  Current outpatient prescriptions:amLODipine (NORVASC) 10 MG tablet, Take 10 mg by mouth daily.  , Disp: , Rfl: ;  dicyclomine (BENTYL) 20 MG tablet, Take 1 tablet (20 mg total) by mouth 2 (two) times daily before a meal., Disp: 60 tablet, Rfl: 1;  loperamide (IMODIUM A-D) 2 MG tablet, Take 2 mg by mouth 4 (four) times daily as needed.  , Disp: , Rfl: ;  metoprolol tartrate (LOPRESSOR) 25 MG tablet, Take 25 mg by mouth daily., Disp: , Rfl:  [DISCONTINUED] hyoscyamine (LEVBID) 0.375 MG 12 hr tablet, Take 0.375 mg by mouth every 12 (twelve) hours as needed., Disp: , Rfl:   BP 165/66  Pulse 52  Resp 16  Ht 6\' 1"  (1.854 m)  Wt 171 lb (77.565 kg)  BMI 22.56 kg/m2  Body mass index is 22.56 kg/(m^2).           Review of Systems denies chest pain, dyspnea on exertion, PND, orthopnea, hemoptysis, claudication. Other systems negative and complete review of systems     Objective:   Physical Exam blood pressure 165/66 heart rate 52 respirations 16 Gen.-alert and oriented x3 in no apparent  distress HEENT normal for age Lungs no rhonchi or wheezing Cardiovascular regular rhythm no murmurs carotid pulses 3+ palpable no bruits audible Abdomen soft nontender no palpable masses Musculoskeletal free of  major deformities Skin clear -no rashes Neurologic normal Lower extremities 3+ femoral and dorsalis pedis pulses palpable bilaterally with 1+ edema bilaterally. Left leg has some mildly bulging varicosities on the lateral aspect beginning at the knee and extending down into the pretibial area. There is no hyperpigmentation or ulceration in either lower extremity.  Today I ordered bilateral venous duplex exam which I reviewed and interpreted. There is no superficial reflux in the great or small saphenous systems bilaterally. There is deep venous reflux bilaterally. There is no DVT.       Assessment:     Bilateral venous insufficiency due to deep venous reflux-    Plan:     #1 elevate foot of bed 2 inches at night #2 wear short-leg elastic compression stockings on daily basis #3 no other specific treatment indicated do not believe varicose veins in left leg are symptomatic or need treatment

## 2013-03-15 ENCOUNTER — Encounter (INDEPENDENT_AMBULATORY_CARE_PROVIDER_SITE_OTHER): Payer: Medicare HMO | Admitting: Internal Medicine

## 2013-03-15 DIAGNOSIS — N184 Chronic kidney disease, stage 4 (severe): Secondary | ICD-10-CM

## 2013-03-15 DIAGNOSIS — D472 Monoclonal gammopathy: Secondary | ICD-10-CM

## 2013-07-19 ENCOUNTER — Encounter: Payer: Self-pay | Admitting: Orthopedic Surgery

## 2013-07-19 ENCOUNTER — Ambulatory Visit (INDEPENDENT_AMBULATORY_CARE_PROVIDER_SITE_OTHER): Payer: Medicare Other

## 2013-07-19 ENCOUNTER — Ambulatory Visit (INDEPENDENT_AMBULATORY_CARE_PROVIDER_SITE_OTHER): Payer: Medicare Other | Admitting: Orthopedic Surgery

## 2013-07-19 VITALS — BP 145/69 | Ht 73.0 in | Wt 174.0 lb

## 2013-07-19 DIAGNOSIS — M25562 Pain in left knee: Secondary | ICD-10-CM

## 2013-07-19 DIAGNOSIS — M25561 Pain in right knee: Secondary | ICD-10-CM

## 2013-07-19 DIAGNOSIS — M179 Osteoarthritis of knee, unspecified: Secondary | ICD-10-CM

## 2013-07-19 DIAGNOSIS — IMO0002 Reserved for concepts with insufficient information to code with codable children: Secondary | ICD-10-CM

## 2013-07-19 DIAGNOSIS — M25569 Pain in unspecified knee: Secondary | ICD-10-CM

## 2013-07-19 DIAGNOSIS — M1710 Unilateral primary osteoarthritis, unspecified knee: Secondary | ICD-10-CM

## 2013-07-19 NOTE — Patient Instructions (Signed)
Wear and Tear Disorders of the Knee (Arthritis, Osteoarthritis)  Everyone will experience wear and tear injuries (arthritis, osteoarthritis) of the knee. These are the changes we all get as we age. They come from the joint stress of daily living. The amount of cartilage damage in your knee and your symptoms determine if you need surgery. Mild problems require approximately two months recovery time. More severe problems take several months to recover. With mild problems, your surgeon may find worn and rough cartilage surfaces. With severe changes, your surgeon may find cartilage that has completely worn away and exposed the bone. Loose bodies of bone and cartilage, bone spurs (excess bone growth), and injuries to the menisci (cushions between the large bones of your leg) are also common. All of these problems can cause pain.  For a mild wear and tear problem, rough cartilage may simply need to be shaved and smoothed. For more severe problems with areas of exposed bone, your surgeon may use an instrument for roughing up the bone surfaces to stimulate new cartilage growth. Loose bodies are usually removed. Torn menisci may be trimmed or repaired.  ABOUT THE ARTHROSCOPIC PROCEDURE  Arthroscopy is a surgical technique. It allows your orthopedic surgeon to diagnose and treat your knee injury with accuracy. The surgeon looks into your knee through a small scope. The scope is like a small (pencil-sized) telescope. Arthroscopy is less invasive than open knee surgery. You can expect a more rapid recovery. After the procedure, you will be moved to a recovery area until most of the effects of the medication have worn off. Your caregiver will discuss the test results with you.  RECOVERY  The severity of the arthritis and the type of procedure performed will determine recovery time. Other important factors include age, physical condition, medical conditions, and the type of rehabilitation program. Strengthening your muscles after  arthroscopy helps guarantee a better recovery. Follow your caregiver's instructions. Use crutches, rest, elevate, ice, and do knee exercises as instructed. Your caregivers will help you and instruct you with exercises and other physical therapy required to regain your mobility, muscle strength, and functioning following surgery. Only take over-the-counter or prescription medicines for pain, discomfort, or fever as directed by your caregiver.   SEEK MEDICAL CARE IF:   · There is increased bleeding (more than a small spot) from the wound.  · You notice redness, swelling, or increasing pain in the wound.  · Pus is coming from wound.  · You develop an unexplained oral temperature above 102° F (38.9° C) , or as your caregiver suggests.  · You notice a foul smell coming from the wound or dressing.  · You have severe pain with motion of the knee.  SEEK IMMEDIATE MEDICAL CARE IF:   · You develop a rash.  · You have difficulty breathing.  · You have any allergic problems.  MAKE SURE YOU:   · Understand these instructions.  · Will watch your condition.  · Will get help right away if you are not doing well or get worse.  Document Released: 10/23/2000 Document Revised: 01/18/2012 Document Reviewed: 03/21/2008  [employer]® Patient Information ©2014 [employer].

## 2013-07-19 NOTE — Progress Notes (Signed)
Patient ID: Kevin [name], male   DOB: [birth year], 79 y.o.   MRN: [id number]  Chief Complaint  Patient presents with  . Knee Pain    Bilateral knee pain   HISTORY: 79 year old male previously seen by our practice given knee injections for arthritis presents with bilateral knee pain gradual onset present for over 2 years. Pain seems to come at the end of the day after activity rates his pain 8/10. Pain comes and goes better with rest associated with some swelling. Been functionally doing great well he has some kidney disease which prevents I believe anti-inflammatory medication  History of diarrhea urgency joint pain and swelling denies chest pain shortness of breath heartburn skin changes numbness or tingling. His current medications include 10 mg of amlodipine and a multivitamin he said a cholecystectomy he has hypertension and kidney disease family history of cancer he is married retired does not smoke  This is a well-developed well-nourished male grooming and hygiene are excellent he is oriented x3 his mood and affect are normal in place with no assistive devices. BP 145/69  Ht 6\' 1"  (1.854 m)  Wt 174 lb (78.926 kg)  BMI 22.96 kg/m2 Bilateral knee exam shows bilateral varus knees with medial joint line tenderness his range of motion is pretty much stuck at 115 both knees are stable he doesn't have a major joint effusion if any motor exam is normal skin is intact pulses are good sensation is normal balance is normal  Bilateral x-rays show severe arthritis bone to bone changes characteristic findings, see the report  Followup in 6 months reassess after bilateral knee injections today  Knee  Injection Procedure Note  Pre-operative Diagnosis: left knee oa  Post-operative Diagnosis: same  Indications: pain  Anesthesia: ethyl chloride   Procedure Details   Verbal consent was obtained for the procedure. Time out was completed.The joint was prepped with alcohol, followed by  Ethyl chloride  spray and A 20 gauge needle was inserted into the knee via lateral approach; 4ml 1% lidocaine and 1 ml of depomedrol  was then injected into the joint . The needle was removed and the area cleansed and dressed.  Complications:  None; patient tolerated the procedure well. Knee  Injection Procedure Note  Pre-operative Diagnosis: right knee oa  Post-operative Diagnosis: same  Indications: pain  Anesthesia: ethyl chloride   Procedure Details   Verbal consent was obtained for the procedure. Time out was completed.The joint was prepped with alcohol, followed by  Ethyl chloride spray and A 20 gauge needle was inserted into the knee via lateral approach; 4ml 1% lidocaine and 1 ml of depomedrol  was then injected into the joint . The needle was removed and the area cleansed and dressed.  Complications:  None; patient tolerated the procedure well.

## 2013-10-25 ENCOUNTER — Encounter (INDEPENDENT_AMBULATORY_CARE_PROVIDER_SITE_OTHER): Payer: Self-pay | Admitting: Internal Medicine

## 2013-10-25 ENCOUNTER — Ambulatory Visit (INDEPENDENT_AMBULATORY_CARE_PROVIDER_SITE_OTHER): Payer: Medicare Other | Admitting: Internal Medicine

## 2013-10-25 VITALS — BP 132/60 | HR 64 | Temp 98.1°F | Ht 73.0 in | Wt 178.2 lb

## 2013-10-25 DIAGNOSIS — K589 Irritable bowel syndrome without diarrhea: Secondary | ICD-10-CM

## 2013-10-25 MED ORDER — HYOSCYAMINE SULFATE 0.125 MG SL SUBL: 0.1250 mg | SUBLINGUAL_TABLET | SUBLINGUAL | Status: DC | PRN

## 2013-10-25 NOTE — Progress Notes (Signed)
Subjective:     Patient ID: Kevin [name], male   DOB: [birth year], 79 y.o.   MRN: [id number]  HPI Here today for f/u. He says when he eats, he will have lower abdominal pain. After he eats he has to go to the [facility] for a BM. Hx of IBS. When he eats cereal he will take Lactaid tab. Appetite is good. No weight loss. He has actually gained about 8 pounds since his last visit.   No abdominal cramps except after he eats. He usually has 0-to 5-6 times a day.  No melena or bright red rectal bleeding.   02/04/2011 Colonoscopy:FINAL DIAGNOSES: Diarrhea, lower abdominal pain 1. Left-sided diverticulosis. He has multiple diverticula at sigmoid  and few at descending colon.  2. No evidence of colonic polyps or endoscopic colitis.  3. Random biopsies taken from mucosa of sigmoid colon.  4. Small internal hemorrhoids. Biopsy: Negative  Review of Systems     Objective:   Physical Exam  Filed Vitals:   10/25/13 1549  BP: 132/60  Pulse: 64  Temp: 98.1 F (36.7 C)  Height: 6\' 1"  (1.854 m)  Weight: 178 lb 3.2 oz (80.831 kg)  Alert and oriented. Skin warm and dry. Oral mucosa is moist.   . Sclera anicteric, conjunctivae is pink. Thyroid not enlarged. No cervical lymphadenopathy. Lungs clear. Heart regular rate and rhythm.  Abdomen is soft. Bowel sounds are positive. No hepatomegaly. No abdominal masses felt. No tenderness.  No edema to lower extremities.        Assessment:    Diarrhea and lower abdominal cramps, presumed to be IBS. Normal exam today. Recent colonoscopy in 2012 did not show endoscopic colitis.     Plan:    Rx for Levsin 0.125mg  eprescribed to his pharmacy. Fiber 4 gms daily. OV in 1 month. I will talk with Dr. Karilyn Cota about a possible colonoscopy at patient and wife's request.

## 2013-10-25 NOTE — Patient Instructions (Addendum)
Rx for Levsin eprescribed to his pharmacy. Fiber 4 gms daily. OV in 1 month. I will talk with Dr. Karilyn Cota concerning repeat colonoscopy at patient's request.

## 2013-11-27 ENCOUNTER — Ambulatory Visit (INDEPENDENT_AMBULATORY_CARE_PROVIDER_SITE_OTHER): Payer: Medicare Other | Admitting: Internal Medicine

## 2013-12-20 ENCOUNTER — Ambulatory Visit (INDEPENDENT_AMBULATORY_CARE_PROVIDER_SITE_OTHER): Payer: Medicare Other | Admitting: Internal Medicine

## 2013-12-20 ENCOUNTER — Encounter (INDEPENDENT_AMBULATORY_CARE_PROVIDER_SITE_OTHER): Payer: Self-pay | Admitting: Internal Medicine

## 2013-12-20 VITALS — BP 142/50 | HR 60 | Temp 97.1°F | Ht 73.0 in | Wt 178.4 lb

## 2013-12-20 DIAGNOSIS — K589 Irritable bowel syndrome without diarrhea: Secondary | ICD-10-CM

## 2013-12-20 DIAGNOSIS — R197 Diarrhea, unspecified: Secondary | ICD-10-CM

## 2013-12-20 DIAGNOSIS — K529 Noninfective gastroenteritis and colitis, unspecified: Secondary | ICD-10-CM

## 2013-12-20 MED ORDER — HYOSCYAMINE SULFATE 0.125 MG SL SUBL: 0.1250 mg | SUBLINGUAL_TABLET | SUBLINGUAL | Status: AC | PRN

## 2013-12-20 MED ORDER — HYOSCYAMINE SULFATE 0.125 MG SL SUBL: 0.1250 mg | SUBLINGUAL_TABLET | SUBLINGUAL | Status: DC | PRN

## 2013-12-20 NOTE — Patient Instructions (Signed)
Celiac panel. OV in 3 months. Continue Levsin.

## 2013-12-20 NOTE — Progress Notes (Signed)
Subjective:     Patient ID: Kevin [name], male   DOB: 27-Dec-1930, 80 y.o.   MRN: [id number]  HPI  Here today for f/u of his IBS. He says someday are better than others.  Hx of chronic diarrhea. Symptoms for years. Given an Rx for Levsin which did not really help. He takes Metamucil daily. Appetite is good. No weight loss. No acid reflux.  Occasionally has lower abdominal pain with BMs. He usually has a BM   every meal. No melena or bright red rectal bleeding.     02/04/2011 Colonoscopy:INDICATIONS: [place] a 80 year old African American male who has chronic  diarrhea presumed to be due to irritable bowel syndrome. He also has  lower abdominal pain or cramps. H   FINAL DIAGNOSES: Diarrhea, lower abdominal pain  1. Left-sided diverticulosis. He has multiple diverticula at sigmoid  and few at descending colon.  2. No evidence of colonic polyps or endoscopic colitis.  3. Random biopsies taken from mucosa of sigmoid colon.  4. Small internal hemorrhoids.  Biopsy: Negative   Review of Systems Current Outpatient Prescriptions  Medication Sig Dispense Refill  . amLODipine (NORVASC) 10 MG tablet Take 10 mg by mouth daily.        . cholecalciferol (VITAMIN D) 400 UNITS TABS tablet Take 1,000 Units by mouth.      . loperamide (IMODIUM A-D) 2 MG tablet Take 2 mg by mouth 4 (four) times daily as needed.        . pantoprazole (PROTONIX) 40 MG tablet Take 40 mg by mouth daily.      . potassium chloride SA (K-DUR,KLOR-CON) 20 MEQ tablet Take 20 mEq by mouth 2 (two) times daily.      . hyoscyamine (LEVSIN SL) 0.125 MG SL tablet Place 1 tablet (0.125 mg total) under the tongue every 4 (four) hours as needed.  60 tablet  3   No current facility-administered medications for this visit.   Past Medical History  Diagnosis Date  . Hypertension   . Irritable bowel   . Kidney damage    Past Surgical History  Procedure Laterality Date  . Cholecystectomy      over 20 yrs ago   No Known Allergies    Objective:   Physical Exam  Filed Vitals:   12/20/13 1448  BP: 142/50  Pulse: 60  Temp: 97.1 F (36.2 C)  Height: 6\' 1"  (1.854 m)  Weight: 178 lb 6.4 oz (80.922 kg)   Alert and oriented. Skin warm and dry. Oral mucosa is moist.   . Sclera anicteric, conjunctivae is pink. Thyroid not enlarged. No cervical lymphadenopathy. Lungs clear. Heart regular rate and rhythm.  Abdomen is soft. Bowel sounds are positive. No hepatomegaly. No abdominal masses felt. No tenderness.  No edema to lower extremities. Patient is alert and oriented.     Assessment:    Chronic diarrhea. probably IBS. Hx of same for yrs. Recent colonoscopy was normal.  No microscopic colitis.   Plan:    Levsin .125mg  sl as needed. Continue the Metamucil. Celiac panel.  OV in 3 months.

## 2013-12-21 LAB — TISSUE TRANSGLUTAMINASE, IGA: TISSUE TRANSGLUTAMINASE AB, IGA: 11.8 U/mL (ref <20)

## 2013-12-21 LAB — GLIADIN ANTIBODIES, SERUM
Gliadin IgA: 13.6 U/mL (ref <20)
Gliadin IgG: 14.2 U/mL (ref <20)

## 2013-12-22 LAB — RETICULIN ANTIBODIES, IGA W TITER: Reticulin Ab, IgA: NEGATIVE

## 2014-01-15 ENCOUNTER — Telehealth: Payer: Self-pay | Admitting: Orthopedic Surgery

## 2014-01-15 NOTE — Telephone Encounter (Signed)
Patient called to cancel his 20-month appointment, due to tending to wife, in hospital.  Offered re-schedule; states he prefers to call back at a later time. when he can re-schedule.

## 2014-01-16 ENCOUNTER — Ambulatory Visit: Payer: Medicare Other | Admitting: Orthopedic Surgery

## 2014-04-09 ENCOUNTER — Ambulatory Visit (INDEPENDENT_AMBULATORY_CARE_PROVIDER_SITE_OTHER): Payer: Medicare Other | Admitting: Internal Medicine

## 2014-04-09 ENCOUNTER — Encounter (INDEPENDENT_AMBULATORY_CARE_PROVIDER_SITE_OTHER): Payer: Self-pay | Admitting: Internal Medicine

## 2014-04-09 VITALS — BP 128/68 | HR 60 | Temp 98.3°F | Resp 18 | Ht 73.0 in | Wt 178.1 lb

## 2014-04-09 DIAGNOSIS — K589 Irritable bowel syndrome without diarrhea: Secondary | ICD-10-CM

## 2014-04-09 MED ORDER — LOPERAMIDE HCL 2 MG PO TABS: 1.0000 mg | ORAL_TABLET | Freq: Every day | ORAL | Status: AC

## 2014-04-09 MED ORDER — PSYLLIUM 28 % PO PACK: 1.0000 | PACK | Freq: Every day | ORAL | Status: AC

## 2014-04-09 NOTE — Patient Instructions (Addendum)
Imodium OTC 1 mg by mouth every morning. Metamucil 1 packet 4 g by mouth daily at bedtime. Stools diary for 8 weeks as discussed. Can use glycerin or Dulcolax suppository if no bowel movement for 3 days.

## 2014-04-09 NOTE — Progress Notes (Signed)
Presenting complaint;  Followup for IBS.  Subjective:  Patient is 80 year-old [place] American male who presents for reevaluation of chronic diarrhea. He was last seen in February 2015 and given prescription for hyoscyamine sublingual but he did not see any benefit. He is not taking Metamucil as recommended. He has history of lactose intolerance and uses Lactaid pill when he uses milk with cereal. He remains with diarrhea. On most days he has 3-5 stools. His stools are soft and mushy. He denies abdominal pain melena rectal bleeding or nocturnal diarrhea. His appetite is good and his weight remains stable. Heartburn is well controlled with PPI. He is not having any accidents. He is using Imodium as needed basis. He usually takes one to 2 tablets on a given day. He goes for having diarrhea to constipation and may not have a BM for 2 days. He is not having any side effects of Imodium.   Current Medications: Outpatient Encounter Prescriptions as of 04/09/2014  Medication Sig  . amLODipine (NORVASC) 10 MG tablet Take 10 mg by mouth daily.    . hyoscyamine (LEVSIN SL) 0.125 MG SL tablet Place 1 tablet (0.125 mg total) under the tongue every 4 (four) hours as needed.  . loperamide (IMODIUM A-D) 2 MG tablet Take 2 mg by mouth 4 (four) times daily as needed.    . Multiple Vitamins-Minerals (CENTRUM ADULTS PO) Take by mouth daily.  . pantoprazole (PROTONIX) 40 MG tablet Take 40 mg by mouth daily.  . potassium chloride SA (K-DUR,KLOR-CON) 20 MEQ tablet Take 20 mEq by mouth daily.   . [DISCONTINUED] cholecalciferol (VITAMIN D) 400 UNITS TABS tablet Take 1,000 Units by mouth.     Objective: Blood pressure 128/68, pulse 60, temperature 98.3 F (36.8 C), temperature source Oral, resp. rate 18, height 6\' 1"  (1.854 m), weight 178 lb 1.6 oz (80.786 kg). Patient is alert and in no acute distress. Conjunctiva is pink. Sclera is nonicteric Oropharyngeal mucosa is normal. No neck masses or thyromegaly  noted. Cardiac exam with regular rhythm normal S1 and S2. No murmur or gallop noted. Lungs are clear to auscultation. Abdomen is symmetrical with normal bowel sounds. On palpation abdomen is soft and nontender without organomegaly or masses.  No LE edema or clubbing noted.  Data reviewed;  Patient had colonoscopy in March 2012 revealing left-sided diverticulosis normal appearing mucosa and biopsy from sigmoid colon was negative for microscopic colitis.  Assessment:  #1. Irritable bowel syndrome. He remains with frequent diarrhea but becomes constipated with when necessary use of Imodium. He may do well with low-dose Imodium and regular basis he also must take fiber supplements daily. If these measures do not help probiotic will be added and finally will use IBgard.    Plan:  Imodium OTC 1 mg by mouth every morning.  Metamucil 1 packet 4 g by mouth daily at bedtime.  Stools diary for 8 weeks as discussed.  Can use glycerin or Dulcolax suppository if no bowel movement for 3 days.  Office visit in 6 months.

## 2014-10-09 ENCOUNTER — Ambulatory Visit (INDEPENDENT_AMBULATORY_CARE_PROVIDER_SITE_OTHER): Payer: Medicare Other | Admitting: Internal Medicine

## 2014-10-31 ENCOUNTER — Encounter (INDEPENDENT_AMBULATORY_CARE_PROVIDER_SITE_OTHER): Payer: Self-pay | Admitting: *Deleted

## 2014-10-31 ENCOUNTER — Encounter (INDEPENDENT_AMBULATORY_CARE_PROVIDER_SITE_OTHER): Payer: Self-pay

## 2014-10-31 ENCOUNTER — Telehealth (INDEPENDENT_AMBULATORY_CARE_PROVIDER_SITE_OTHER): Payer: Self-pay | Admitting: *Deleted

## 2014-10-31 NOTE — Telephone Encounter (Signed)
[place] NO SHOWED for his apt on 10/09/14 with Dr. Laural Golden. A NS letter has been mailed.

## 2014-11-05 NOTE — Telephone Encounter (Signed)
Noted that the patient was a no show.

## 2024-02-08 DEATH — deceased
